# Patient Record
Sex: Male | Born: 1990 | Race: Black or African American | Hispanic: No | Marital: Single | Smoking: Never smoker
Health system: Southern US, Community
[De-identification: ages and names within clinical notes are randomized; demographics above are authoritative.]

---
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
## 2011-04-21 ENCOUNTER — Encounter: Payer: Self-pay | Admitting: *Deleted

## 2011-04-21 ENCOUNTER — Emergency Department (HOSPITAL_BASED_OUTPATIENT_CLINIC_OR_DEPARTMENT_OTHER)
Admission: EM | Admit: 2011-04-21 | Discharge: 2011-04-21 | Disposition: A | Discharge status: Home / Self Care | Payer: No Typology Code available for payment source | Attending: Emergency Medicine | Admitting: Emergency Medicine

## 2011-04-21 DIAGNOSIS — Y92410 Unspecified street and highway as the place of occurrence of the external cause: Secondary | ICD-10-CM | POA: Insufficient documentation

## 2011-04-21 DIAGNOSIS — M549 Dorsalgia, unspecified: Secondary | ICD-10-CM | POA: Insufficient documentation

## 2011-04-21 MED ORDER — CYCLOBENZAPRINE HCL 10 MG PO TABS: 10.0000 mg | ORAL_TABLET | Freq: Two times a day (BID) | ORAL | Status: AC | PRN

## 2011-04-21 MED ORDER — IBUPROFEN 600 MG PO TABS: 600.0000 mg | ORAL_TABLET | Freq: Four times a day (QID) | ORAL | Status: AC | PRN

## 2011-04-21 MED ORDER — OXYCODONE-ACETAMINOPHEN 5-325 MG PO TABS
1.0000 | ORAL_TABLET | Freq: Once | ORAL | Status: AC
  Administered 2011-04-21: 1 via ORAL

## 2011-04-21 MED ORDER — IBUPROFEN 400 MG PO TABS
600.0000 mg | ORAL_TABLET | Freq: Once | ORAL | Status: AC
  Administered 2011-04-21: 600 mg via ORAL
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
## 2013-07-20 ENCOUNTER — Ambulatory Visit (INDEPENDENT_AMBULATORY_CARE_PROVIDER_SITE_OTHER): Payer: 59 | Admitting: Emergency Medicine

## 2013-07-20 VITALS — BP 118/60 | HR 64 | Temp 98.7°F | Resp 18 | Ht 74.0 in | Wt 193.8 lb

## 2013-07-20 DIAGNOSIS — Z0000 Encounter for general adult medical examination without abnormal findings: Secondary | ICD-10-CM
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
## 2013-10-17 ENCOUNTER — Ambulatory Visit: Payer: 59

## 2017-06-14 ENCOUNTER — Ambulatory Visit (INDEPENDENT_AMBULATORY_CARE_PROVIDER_SITE_OTHER): Payer: Worker's Compensation

## 2017-06-14 ENCOUNTER — Ambulatory Visit (INDEPENDENT_AMBULATORY_CARE_PROVIDER_SITE_OTHER): Payer: Worker's Compensation | Admitting: Orthopaedic Surgery

## 2017-06-14 ENCOUNTER — Encounter (INDEPENDENT_AMBULATORY_CARE_PROVIDER_SITE_OTHER): Payer: Self-pay | Admitting: Orthopaedic Surgery

## 2017-06-14 DIAGNOSIS — M25561 Pain in right knee: Secondary | ICD-10-CM

## 2017-06-14 MED ORDER — IBUPROFEN 800 MG PO TABS: 800.0000 mg | ORAL_TABLET | Freq: Three times a day (TID) | ORAL | 3 refills | Status: DC | PRN

## 2017-06-14 NOTE — Progress Notes (Signed)
Office Visit Note   Patient: Jerome [name].           Date of Birth: [birth year]           MRN: [id number] Visit Date: 06/14/2017              Requested by: Daisy Floro, MD [address] [place], [place] [address] PCP: Daisy Floro, MD   Assessment & Plan: Visit Diagnoses:  1. Acute pain of right knee     Plan: Overall impression is acute lateral patella subluxation from hyperextension injury. His MRI findings are unremarkable as well as his x-ray. I offered him a cortisone injection which he declined.  Overall he is presenting with a fair amount of pain. The it's a good idea to hold off on physical therapy for a couple weeks before starting PT. Work note for sedentary work for 6 weeks. Follow-up at that time for recheck.  Follow-Up Instructions: Return in about 6 weeks (around 07/26/2017).   Orders:  Orders Placed This Encounter  Procedures  . XR KNEE 3 VIEW RIGHT   Meds ordered this encounter  Medications  . ibuprofen (ADVIL,MOTRIN) 800 MG tablet    Sig: Take 1 tablet (800 mg total) by mouth every 8 (eight) hours as needed.    Dispense:  30 tablet    Refill:  3      Procedures: No procedures performed   Clinical Data: No additional findings.   Subjective: Chief Complaint  Patient presents with  . Right Knee - Pain    Patient is a 26 year old gentleman who comes in with a one-month history of right knee pain status post a work-related injury in which he hyperextended his knee. He  originally had swelling and weakness and locking which she continues to have except for the swelling. He's been wearing a hinged knee brace. He has had x-rays and MRI which were normal. He is supposed to start physical therapy next week. He does not feel ready. Denies any numbness and tingling.     Review of Systems  Constitutional: Negative.   All other systems reviewed and are negative.    Objective: Vital Signs: There were no vitals taken for this  visit.  Physical Exam  Constitutional: He is oriented to person, place, and time. He appears well-developed and well-nourished.  HENT:  Head: Normocephalic and atraumatic.  Eyes: Pupils are equal, round, and reactive to light.  Neck: Neck supple.  Pulmonary/Chest: Effort normal.  Abdominal: Soft.  Musculoskeletal: Normal range of motion.  Neurological: He is alert and oriented to person, place, and time.  Skin: Skin is warm.  Psychiatric: He has a normal mood and affect. His behavior is normal. Judgment and thought content normal.  Nursing note and vitals reviewed.   Ortho Exam Right knee exam shows no joint effusion. Collaterals and cruciates are grossly intact. He does have lateral joint line tenderness. Patellar tracking is normal. Specialty Comments:  No specialty comments available.  Imaging: Xr Knee 3 View Right  Result Date: 06/14/2017 No acute bony abnormalities    PMFS History: There are no active problems to display for this patient.  No past medical history on file.  Family History  Problem Relation Age of Onset  . Diabetes Mother   . Hypertension Father   . Diabetes Paternal Grandmother   . Hypertension Paternal Grandmother     No past surgical history on file. Social History   Occupational History  . Not on file.  Social History Main Topics  . Smoking status: Never Smoker  . Smokeless tobacco: Never Used  . Alcohol use No  . Drug use: No  . Sexual activity: No

## 2017-06-24 ENCOUNTER — Telehealth (INDEPENDENT_AMBULATORY_CARE_PROVIDER_SITE_OTHER): Payer: Self-pay

## 2017-06-24 NOTE — Telephone Encounter (Signed)
Faxed the 06/16/17 office and work note to [employer] Jerome [name] [phone]

## 2017-07-07 ENCOUNTER — Telehealth (INDEPENDENT_AMBULATORY_CARE_PROVIDER_SITE_OTHER): Payer: Self-pay | Admitting: Orthopaedic Surgery

## 2017-07-07 NOTE — Telephone Encounter (Signed)
[name],Jerome Jr. 1990/12/09    [facility]  Reference number pt ([id number]) Contact [phone]  Please call to verify if an MRI order was placed

## 2017-07-08 NOTE — Telephone Encounter (Signed)
Last seen  06/14/17 xrays were done that day   Per Dr Roda ShuttersXu His MRI findings are unremarkable as well as his x-ray. (he must of brought in CD of MRI)  We do not have record of where he had this done. MRI order is not made in our system.

## 2017-07-26 ENCOUNTER — Ambulatory Visit (INDEPENDENT_AMBULATORY_CARE_PROVIDER_SITE_OTHER): Payer: Worker's Compensation | Admitting: Orthopaedic Surgery

## 2017-07-26 ENCOUNTER — Encounter (INDEPENDENT_AMBULATORY_CARE_PROVIDER_SITE_OTHER): Payer: Self-pay | Admitting: Orthopaedic Surgery

## 2017-07-26 DIAGNOSIS — M25561 Pain in right knee: Secondary | ICD-10-CM

## 2017-07-26 MED ORDER — LIDOCAINE HCL 1 % IJ SOLN
2.0000 mL | INTRAMUSCULAR | Status: AC | PRN
  Administered 2017-07-26: 2 mL

## 2017-07-26 MED ORDER — METHYLPREDNISOLONE ACETATE 40 MG/ML IJ SUSP
40.0000 mg | INTRAMUSCULAR | Status: AC | PRN
  Administered 2017-07-26: 40 mg via INTRA_ARTICULAR

## 2017-07-26 MED ORDER — BUPIVACAINE HCL 0.5 % IJ SOLN
2.0000 mL | INTRAMUSCULAR | Status: AC | PRN
  Administered 2017-07-26: 2 mL via INTRA_ARTICULAR

## 2017-07-26 NOTE — Progress Notes (Signed)
   Office Visit Note   Patient: Jerome GrumblingDouglas J Amir Jr.           Date of Birth: 04/02/1991           MRN: [id number] Visit Date: 07/26/2017              Requested by: Daisy Florooss, Charles Alan, MD [address] [place], [place] [address] PCP: Daisy Florooss, Charles Alan, MD   Assessment & Plan: Visit Diagnoses:  1. Acute pain of right knee     Plan: Impression is right knee pain.  Cortisone injection was performed today.  I would like him to do 6 weeks of physical therapy and then reevaluate after that.  Work note provided today.  Follow-up in 6 weeks.  Follow-Up Instructions: Return in about 6 weeks (around 09/06/2017).   Orders:  No orders of the defined types were placed in this encounter.  No orders of the defined types were placed in this encounter.     Procedures: Large Joint Inj: R knee on 07/26/2017 2:24 PM Indications: pain Details: 22 G needle  Arthrogram: No  Medications: 40 mg methylPREDNISolone acetate 40 MG/ML; 2 mL lidocaine 1 %; 2 mL bupivacaine 0.5 % Consent was given by the patient. Patient was prepped and draped in the usual sterile fashion.       Clinical Data: No additional findings.   Subjective: Chief Complaint  Patient presents with  . Right Knee - Pain    Patient follows up today for right knee pain.  She has recently had his physical therapy approved by [employer].  He continues to have right knee pain.    Review of Systems   Objective: Vital Signs: There were no vitals taken for this visit.  Physical Exam  Ortho Exam Right knee exam shows no joint effusion.  Collaterals and cruciates are stable.  Mild medial joint line tenderness.  Patella tracking is normal. Specialty Comments:  No specialty comments available.  Imaging: No results found.   PMFS History: There are no active problems to display for this patient.  History reviewed. No pertinent past medical history.  Family History  Problem Relation Age of Onset  . Diabetes  Mother   . Hypertension Father   . Diabetes Paternal Grandmother   . Hypertension Paternal Grandmother     History reviewed. No pertinent surgical history. Social History   Occupational History  . Not on file  Tobacco Use  . Smoking status: Never Smoker  . Smokeless tobacco: Never Used  Substance and Sexual Activity  . Alcohol use: No  . Drug use: No  . Sexual activity: No

## 2017-08-02 ENCOUNTER — Telehealth (INDEPENDENT_AMBULATORY_CARE_PROVIDER_SITE_OTHER): Payer: Self-pay

## 2017-08-02 NOTE — Telephone Encounter (Signed)
Faxed the 07/26/17 office note to the adj per her request

## 2017-08-16 ENCOUNTER — Telehealth (INDEPENDENT_AMBULATORY_CARE_PROVIDER_SITE_OTHER): Payer: Self-pay

## 2017-08-16 NOTE — Telephone Encounter (Signed)
[facility] the 06/14/17 [facility]26/17 [facility] per their request [phone]#[phone]

## 2017-08-30 HISTORY — PX: KNEE ARTHROSCOPY: SHX127

## 2017-09-08 ENCOUNTER — Encounter (INDEPENDENT_AMBULATORY_CARE_PROVIDER_SITE_OTHER): Payer: Self-pay | Admitting: Orthopaedic Surgery

## 2017-09-08 ENCOUNTER — Ambulatory Visit (INDEPENDENT_AMBULATORY_CARE_PROVIDER_SITE_OTHER): Payer: Worker's Compensation | Admitting: Orthopaedic Surgery

## 2017-09-08 DIAGNOSIS — M25561 Pain in right knee: Secondary | ICD-10-CM

## 2017-09-08 MED ORDER — MELOXICAM 7.5 MG PO TABS: 7.5000 mg | ORAL_TABLET | Freq: Every day | ORAL | 2 refills | Status: DC | PRN

## 2017-09-08 MED ORDER — HYDROCODONE-ACETAMINOPHEN 7.5-325 MG PO TABS: 1.0000 | ORAL_TABLET | Freq: Two times a day (BID) | ORAL | 0 refills | Status: DC | PRN

## 2017-09-08 NOTE — Progress Notes (Signed)
Office Visit Note   Patient: Jerome [name].           Date of Birth: 01/10/1991           MRN: [id number] Visit Date: 09/08/2017              RequCarolanne Grumblingested by: Daisy Florooss, Charles Alan, MD [address] [place], [place] [address] PCP: Daisy Florooss, Charles Alan, MD   Assessment & Plan: Visit Diagnoses:  1. Acute pain of right knee     Plan: At this point I would like for Jerome [name] to continue with outpatient physical therapy for at least another 3 weeks.  I have refilled his Norco.  Likely we will not refill this again.  I also placed him on Mobic as needed.  He will follow-up with us in 3 weeks time, at that point if he is not any better we may entertain proceeding with a diagnostic arthroscopy.  Follow-Up Instructions: Return in about 3 weeks (around 09/29/2017).   Orders:  No orders of the defined types were placed in this encounter.  Meds ordered this encounter  Medications  . HYDROcodone-acetaminophen (NORCO) 7.5-325 MG tablet    Sig: Take 1 tablet by mouth 2 (two) times daily as needed for moderate pain.    Dispense:  10 tablet    Refill:  0    Order Specific Question:   Supervising Provider    Answer:   Tarry KosXU, NAIPING M [[id number]]  . meloxicam (MOBIC) 7.5 MG tablet    Sig: Take 1 tablet (7.5 mg total) by mouth daily as needed for up to 14 doses for pain.    Dispense:  30 tablet    Refill:  2    Order Specific Question:   Supervising Provider    Answer:   Tarry KosXU, NAIPING M [[id number]]      Procedures: No procedures performed   Clinical Data: No additional findings.   Subjective: Chief Complaint  Patient presents with  . Right Knee - Pain    HPI this is a pleasant 27 year old gentleman who presents our clinic today for follow-up of his right knee.  Initial injury is under [employer]. which did occur on 05/17/2017 when he hyperflexed his knee.  Since his injury he has had x-rays as well as an MRI of his right knee which were both negative.  He had a cortisone injection on  07/26/2017 which made his pain worse.  He states he did not have relief even with Marcaine in place.  We have written him to get outpatient physical therapy for which he is been in last 3 weeks.  He feels as though his pain is really not much better.  He is still working light duty.  Review of Systems as detailed in HPI.  All others reviewed and are negative.   Objective: Vital Signs: There were no vitals taken for this visit.  Physical Exam well-developed well-nourished gentleman in no acute distress.  Alert and oriented x3.  Ortho Exam examination of his right knee reveals range of motion from 0-75 degrees.  Minimal patellofemoral crepitus.  No patellar apprehension.  He is stable valgus varus stress.  He is to rest intact distally.  Specialty Comments:  No specialty comments available.  Imaging: No results found.   PMFS History: There are no active problems to display for this patient.  History reviewed. No pertinent past medical history.  Family History  Problem Relation Age of Onset  . Diabetes Mother   . Hypertension Father   .  Diabetes Paternal Grandmother   . Hypertension Paternal Grandmother     History reviewed. No pertinent surgical history. Social History   Occupational History  . Not on file  Tobacco Use  . Smoking status: Never Smoker  . Smokeless tobacco: Never Used  Substance and Sexual Activity  . Alcohol use: No  . Drug use: No  . Sexual activity: No

## 2017-09-14 ENCOUNTER — Telehealth (INDEPENDENT_AMBULATORY_CARE_PROVIDER_SITE_OTHER): Payer: Self-pay

## 2017-09-14 NOTE — Telephone Encounter (Signed)
Faxed the 09/08/17 office note to the [facility] adj per her request

## 2017-10-04 ENCOUNTER — Encounter (INDEPENDENT_AMBULATORY_CARE_PROVIDER_SITE_OTHER): Payer: Self-pay | Admitting: Orthopaedic Surgery

## 2017-10-04 ENCOUNTER — Ambulatory Visit (INDEPENDENT_AMBULATORY_CARE_PROVIDER_SITE_OTHER): Payer: Worker's Compensation | Admitting: Orthopaedic Surgery

## 2017-10-04 DIAGNOSIS — G8929 Other chronic pain: Secondary | ICD-10-CM

## 2017-10-04 DIAGNOSIS — M25561 Pain in right knee: Secondary | ICD-10-CM | POA: Diagnosis not present

## 2017-10-04 MED ORDER — IBUPROFEN-FAMOTIDINE 800-26.6 MG PO TABS: 1.0000 | ORAL_TABLET | Freq: Three times a day (TID) | ORAL | 0 refills | Status: DC | PRN

## 2017-10-04 NOTE — Progress Notes (Signed)
Office Visit Note   Patient: Jerome GrumblingDouglas J Hollan Jr.           Date of Birth: 07/05/1991           MRN: [id number] Visit Date: 10/04/2017              Requested by: Daisy Florooss, Charles Alan, MD [address] [place], [place] [address] PCP: Daisy Florooss, Charles Alan, MD   Assessment & Plan: Visit Diagnoses:  1. Chronic pain of right knee     Plan: Impression is 27 year old gentleman with continued right knee pain from hyperextension injury about 5-6 months ago that he sustained at work.  His MRI has been negative for structural abnormalities.  His MRI shows edema around the medial patellar facet.  We discussed diagnostic arthroscopy versus a second opinion which I am more in favor of.  His MRI does show some edema around the soft tissue overlying the medial aspect of the patella which she is tender to palpation from.  I also wonder if he is having some sort of abnormal pain response but it does not seem like it is typical CRPS.  I will see him back after his second opinion.  Prescription for Duexis. Total face to face encounter time was greater than 25 minutes and over half of this time was spent in counseling and/or coordination of care.  Follow-Up Instructions: Return if symptoms worsen or fail to improve.   Orders:  Orders Placed This Encounter  Procedures  . Ambulatory referral to Orthopedic Surgery   Meds ordered this encounter  Medications  . Ibuprofen-Famotidine 800-26.6 MG TABS    Sig: Take 1 tablet by mouth 3 (three) times daily as needed.    Dispense:  90 tablet    Refill:  0      Procedures: No procedures performed   Clinical Data: No additional findings.   Subjective: Chief Complaint  Patient presents with  . Right Knee - Pain     Patient follows up today for his right knee pain.  He states that he continues to have pain.  Physical therapy has not helped.    Review of Systems  Constitutional: Negative.   All other systems reviewed and are  negative.    Objective: Vital Signs: There were no vitals taken for this visit.  Physical Exam  Constitutional: He is oriented to person, place, and time. He appears well-developed and well-nourished.  HENT:  Head: Normocephalic and atraumatic.  Eyes: Pupils are equal, round, and reactive to light.  Neck: Neck supple.  Pulmonary/Chest: Effort normal.  Abdominal: Soft.  Musculoskeletal: Normal range of motion.  Neurological: He is alert and oriented to person, place, and time.  Skin: Skin is warm.  Psychiatric: He has a normal mood and affect. His behavior is normal. Judgment and thought content normal.  Nursing note and vitals reviewed.   Ortho Exam Right knee exam is stable.  Collaterals and cruciates are stable.  No joint effusion.   He has tenderness along the medial patellar retinaculum.  Patella mobility is normal. Specialty Comments:  No specialty comments available.  Imaging: No results found.   PMFS History: There are no active problems to display for this patient.  No past medical history on file.  Family History  Problem Relation Age of Onset  . Diabetes Mother   . Hypertension Father   . Diabetes Paternal Grandmother   . Hypertension Paternal Grandmother     No past surgical history on file. Social History  Occupational History  . Not on file  Tobacco Use  . Smoking status: Never Smoker  . Smokeless tobacco: Never Used  Substance and Sexual Activity  . Alcohol use: No  . Drug use: No  . Sexual activity: No

## 2017-10-10 ENCOUNTER — Telehealth (INDEPENDENT_AMBULATORY_CARE_PROVIDER_SITE_OTHER): Payer: Self-pay | Admitting: Orthopaedic Surgery

## 2017-10-10 NOTE — Telephone Encounter (Signed)
10/04/2017 OV NOTE FAXED TO SHEIL[facility] [phone]

## 2017-10-11 ENCOUNTER — Telehealth (INDEPENDENT_AMBULATORY_CARE_PROVIDER_SITE_OTHER): Payer: Self-pay | Admitting: Orthopaedic Surgery

## 2017-10-11 NOTE — Telephone Encounter (Deleted)
[facility]  Jerome [name]   Fax num

## 2017-10-12 NOTE — Telephone Encounter (Signed)
Faxed last office note and referral to Darl PikesSusan (nurse)w/[facility] Fax#[phone]

## 2017-10-12 NOTE — Telephone Encounter (Signed)
[facility] Hassan BucklerSusan  Direct line [phone] Ext: [phone]  Fax # [phone]    [facility] requesting demographic info for pt   Office visit notes  Any work status change notes

## 2017-11-29 ENCOUNTER — Ambulatory Visit: Admit: 2017-11-29 | Payer: 59 | Admitting: Orthopaedic Surgery

## 2017-11-29 SURGERY — ARTHROSCOPY, KNEE
Anesthesia: Choice | Laterality: Right

## 2018-01-04 ENCOUNTER — Other Ambulatory Visit (INDEPENDENT_AMBULATORY_CARE_PROVIDER_SITE_OTHER): Payer: Self-pay | Admitting: Orthopaedic Surgery

## 2018-01-05 NOTE — Telephone Encounter (Signed)
Do you want to fill this Rx?

## 2018-02-12 ENCOUNTER — Other Ambulatory Visit (INDEPENDENT_AMBULATORY_CARE_PROVIDER_SITE_OTHER): Payer: Self-pay | Admitting: Orthopaedic Surgery

## 2018-10-23 ENCOUNTER — Emergency Department (HOSPITAL_BASED_OUTPATIENT_CLINIC_OR_DEPARTMENT_OTHER)
Admission: EM | Admit: 2018-10-23 | Discharge: 2018-10-23 | Disposition: A | Discharge status: Home / Self Care | Payer: No Typology Code available for payment source | Attending: Emergency Medicine | Admitting: Emergency Medicine

## 2018-10-23 ENCOUNTER — Emergency Department (HOSPITAL_BASED_OUTPATIENT_CLINIC_OR_DEPARTMENT_OTHER): Payer: No Typology Code available for payment source

## 2018-10-23 ENCOUNTER — Encounter (HOSPITAL_BASED_OUTPATIENT_CLINIC_OR_DEPARTMENT_OTHER): Payer: Self-pay | Admitting: *Deleted

## 2018-10-23 ENCOUNTER — Other Ambulatory Visit: Payer: Self-pay

## 2018-10-23 DIAGNOSIS — J111 Influenza due to unidentified influenza virus with other respiratory manifestations: Secondary | ICD-10-CM | POA: Diagnosis not present

## 2018-10-23 DIAGNOSIS — R509 Fever, unspecified: Secondary | ICD-10-CM | POA: Diagnosis present

## 2018-10-23 DIAGNOSIS — R69 Illness, unspecified: Secondary | ICD-10-CM

## 2018-10-23 MED ORDER — ACETAMINOPHEN 325 MG PO TABS
650.0000 mg | ORAL_TABLET | Freq: Once | ORAL | Status: AC
  Administered 2018-10-23: 650 mg via ORAL
  Filled 2018-10-23: qty 2

## 2018-10-23 MED ORDER — HYDROCOD POLST-CPM POLST ER 10-8 MG/5ML PO SUER
5.0000 mL | Freq: Once | ORAL | Status: AC
  Administered 2018-10-23: 5 mL via ORAL
  Filled 2018-10-23: qty 5

## 2018-10-23 MED ORDER — DEXTROMETHORPHAN HBR 15 MG/5ML PO SYRP: 10.0000 mL | ORAL_SOLUTION | Freq: Four times a day (QID) | ORAL | 0 refills | Status: DC | PRN

## 2018-10-23 NOTE — Discharge Instructions (Addendum)
Your symptoms are likely caused by the flu. This is a viral infection that will likely start to improve after 5-7 days, antibiotics are not helpful in treating viral infections.  Since your symptoms have been present for more than 2 days Tamiflu will give no additional benefit.  Please make sure you are drinking plenty of fluids. You can treat your symptoms supportively with tylenol/ibuprofen for fevers and pains, Zyrtec and Flonase to heal with nasal congestion, and prescription for cough medicine sent to your pharmacy. You can also try throat lozenges to help with cough.  Unfortunately the flu can cause you to feel bad for several days. If your symptoms are not improving please follow up with you Primary doctor.   Do not drive while taking cough syrup.  If you develop persistent fevers, shortness of breath or difficulty breathing, chest pain, severe headache and neck pain, persistent nausea and vomiting or other new or concerning symptoms return to the Emergency department.

## 2018-10-23 NOTE — ED Notes (Signed)
ED Provider at bedside. 

## 2018-10-23 NOTE — ED Triage Notes (Signed)
Fever, body aches, headache, and chills x 1 week. He took Ibuprofen 1,600 mg today for fever. Pt was educated not to take that amount of Ibuprofen again. He was started on Amoxicillin yesterday for sinusitis. He thinks he has the flu.

## 2018-10-23 NOTE — ED Provider Notes (Signed)
[facility] EMERGENCY DEPARTMENT Provider Note   CSN: [id number] Arrival date & time: 10/23/18  1531    History   Chief Complaint Chief Complaint  Patient presents with  . Fever    HPI Jerome [name]. is a 28 y.o. male otherwise healthy presenting to emergency department today with chief complaint of cough x1 week.  Patient reports the cough is nonproductive.  He was seen by his PCP yesterday, thought to have a sinus infection and started on amoxicillin.  Patient reports he does not feel symptom improvement after starting the amoxicillin. Pt is also reporting associated fever, body aches, headaches, chills.  He has been taking ibuprofen and reports that he took 1600 mg yesterday.  Patient's T-max prior to arrival was 101.7 oral.  Patient reports multiple sick contacts.  Denies shortness of breath, sore throat, chest pain, abdominal pain, nausea, vomiting, diarrhea, urinary symptoms. History provided by patient.    History reviewed. No pertinent past medical history. There are no active problems to display for this patient.   History reviewed. No pertinent surgical history.      Home Medications    Prior to Admission medications   Medication Sig Start Date End Date Taking? Authorizing Provider  dextromethorphan 15 MG/5ML syrup Take 10 mLs (30 mg total) by mouth 4 (four) times daily as needed for cough. 10/23/18   Pang Robers E, PA-C  DUEXIS 800-26.6 MG TABS TAKE 1 TABLET BY MOUTH THREE TIMES A DAY IF NEEDED 02/13/18   Tarry Kos, MD  meloxicam (MOBIC) 7.5 MG tablet Take 1 tablet (7.5 mg total) by mouth daily as needed for up to 14 doses for pain. 09/08/17   Cristie Hem, PA-C    Family History Family History  Problem Relation Age of Onset  . Diabetes Mother   . Hypertension Father   . Diabetes Paternal Grandmother   . Hypertension Paternal Grandmother     Social History Social History   Tobacco Use  . Smoking status: Never Smoker  .  Smokeless tobacco: Never Used  Substance Use Topics  . Alcohol use: No  . Drug use: No     Allergies   Patient has no known allergies.   Review of Systems Review of Systems  Constitutional: Positive for fever.  HENT: Positive for congestion. Negative for ear pain, sinus pressure, sinus pain and sore throat.   Respiratory: Positive for cough.   Gastrointestinal: Negative for abdominal pain, diarrhea, nausea and vomiting.  Neurological: Positive for headaches.  All other systems reviewed and are negative.    Physical Exam Updated Vital Signs BP 127/82 (BP Location: Right Arm)   Pulse 81   Temp 99.2 F (37.3 C) (Oral)   Resp 16   Ht  (1.93 m)   Wt 125.6 kg   SpO2 99%   BMI 33.72 kg/m   Physical Exam Vitals signs and nursing note reviewed.  Constitutional:      Appearance: He is not toxic-appearing or diaphoretic.     Comments: Patient is febrile.  He is coughing and talking in short sentences.  No accessory muscle use.  No pursed lip breathing or nasal flaring.  HENT:     Head: Normocephalic and atraumatic.     Right Ear: Tympanic membrane normal.     Left Ear: Tympanic membrane normal.     Nose: Nose normal.     Mouth/Throat:     Mouth: Mucous membranes are dry.     Pharynx: Oropharynx is clear.  Eyes:     General: No scleral icterus.    Extraocular Movements: Extraocular movements intact.     Conjunctiva/sclera: Conjunctivae normal.     Pupils: Pupils are equal, round, and reactive to light.  Neck:     Musculoskeletal: Normal range of motion.  Cardiovascular:     Rate and Rhythm: Regular rhythm. Tachycardia present.     Pulses: Normal pulses.     Heart sounds: Normal heart sounds.  Pulmonary:     Effort: Pulmonary effort is normal.     Breath sounds: Normal breath sounds. No wheezing.  Abdominal:     General: There is no distension.     Palpations: Abdomen is soft.     Tenderness: There is no abdominal tenderness. There is no guarding or rebound.    Musculoskeletal: Normal range of motion.  Skin:    General: Skin is warm.     Capillary Refill: Capillary refill takes less than 2 seconds.     Findings: No rash.  Neurological:     Mental Status: He is alert. Mental status is at baseline.     Motor: No weakness.  Psychiatric:        Behavior: Behavior normal.      ED Treatments / Results  Labs (all labs ordered are listed, but only abnormal results are displayed) Labs Reviewed - No data to display  EKG None  Radiology Dg Chest 2 View  Result Date: 10/23/2018 CLINICAL DATA:  Cough, fever, and chills for 1 week. EXAM: CHEST - 2 VIEW COMPARISON:  None. FINDINGS: The heart size and mediastinal contours are within normal limits. Both lungs are clear. The visualized skeletal structures are unremarkable. IMPRESSION: No active cardiopulmonary disease. Electronically Signed   By: Myles Rosenthal M.D.   On: 10/23/2018 17:23    Procedures Procedures (including critical care time)  Medications Ordered in ED Medications  chlorpheniramine-HYDROcodone (TUSSIONEX) 10-8 MG/5ML suspension 5 mL (5 mLs Oral Given 10/23/18 1722)  acetaminophen (TYLENOL) tablet 650 mg (650 mg Oral Given 10/23/18 1722)     Initial Impression / Assessment and Plan / ED Course  I have reviewed the triage vital signs and the nursing notes.  Pertinent labs & imaging results that were available during my care of the patient were reviewed by me and considered in my medical decision making (see chart for details).    Patient with symptoms consistent with influenza.  Vitals are stable, low-grade fever.  No signs of dehydration, tolerating PO's.  Lungs are clear. Given duration of cough,  chest xray ordered and is negative for acute infectious processes. Discussed the risk versus benefit of Tamiflu treatment with the patient.  The patient understands that symptoms are greater than the recommended 24-48 hour window of treatment.  Patient will be discharged with instructions  to orally hydrate, rest, and use over-the-counter medications such as anti-inflammatories ibuprofen and Aleve for muscle aches and Tylenol for fever.  Patient will also be given a cough suppressant.  Discussed taking ibuprofen and Tylenol as directed and not going over the maximum dose/day. Pt's fever treated with Tylenol, he has normal heart rate at discharge. Recommend PCP follow-up in 1 week. Discussed strict ED return precautions. Pt verbalized understanding of and is in agreement with this plan. Pt stable for discharge home at this time.      Final Clinical Impressions(s) / ED Diagnoses   Final diagnoses:  Influenza-like illness    ED Discharge Orders         Ordered  dextromethorphan 15 MG/5ML syrup  4 times daily PRN     10/23/18 1805           Kathyrn Lass 10/23/18 2328    Maia Plan, MD 10/24/18 1019

## 2019-06-19 ENCOUNTER — Other Ambulatory Visit: Payer: Self-pay

## 2019-06-19 DIAGNOSIS — Z20822 Contact with and (suspected) exposure to covid-19: Secondary | ICD-10-CM

## 2019-06-21 LAB — NOVEL CORONAVIRUS, NAA: SARS-CoV-2, NAA: NOT DETECTED

## 2019-06-22 ENCOUNTER — Telehealth: Payer: Self-pay | Admitting: General Practice

## 2019-06-22 NOTE — Telephone Encounter (Signed)
Negative COVID results given. Patient results "NOT Detected." Caller expressed understanding. ° °

## 2019-08-10 ENCOUNTER — Other Ambulatory Visit: Payer: Self-pay

## 2019-08-10 DIAGNOSIS — Z20822 Contact with and (suspected) exposure to covid-19: Secondary | ICD-10-CM

## 2019-08-12 LAB — NOVEL CORONAVIRUS, NAA: SARS-CoV-2, NAA: NOT DETECTED

## 2019-11-23 ENCOUNTER — Ambulatory Visit: Payer: Medicaid Other | Attending: Internal Medicine

## 2019-11-23 DIAGNOSIS — Z23 Encounter for immunization: Secondary | ICD-10-CM

## 2019-11-23 NOTE — Progress Notes (Signed)
   [id number]  Name:  Patrik Turnbaugh.    MRN: [id number] DOB: 06/28/1991  11/23/2019  Mr. Mauritz was observed post Covid-19 immunization for 15 minutes without incident. He was provided with Vaccine Information Sheet and instruction to access the V-Safe system.   Mr. Shirer was instructed to call 911 with any severe reactions post vaccine: Marland Kitchen Difficulty breathing  . Swelling of face and throat  . A fast heartbeat  . A bad rash all over body  . Dizziness and weakness   Immunizations Administered    Name Date Dose VIS Date Route   Pfizer COVID-19 Vaccine 11/23/2019  2:46 PM 0.3 mL 08/10/2019 Intramuscular   Manufacturer: [employer], [employer]   Lot: [id number]   NDC: [address]-2

## 2019-12-18 ENCOUNTER — Ambulatory Visit: Payer: Medicaid Other | Attending: Internal Medicine

## 2019-12-18 DIAGNOSIS — Z23 Encounter for immunization: Secondary | ICD-10-CM

## 2019-12-18 NOTE — Progress Notes (Signed)
   [facility]  Name:  [name].    MRN: [id number] DOB: March 15, 1991  12/18/2019  Mr. Fariss was observed post Covid-19 immunization for 15 minutes without incident. He was provided with Vaccine Information Sheet and instruction to access the V-Safe system.   Mr. Raburn was instructed to call 911 with any severe reactions post vaccine: Marland Kitchen Difficulty breathing  . Swelling of face and throat  . A fast heartbeat  . A bad rash all over body  . Dizziness and weakness   Immunizations Administered    Name Date Dose VIS Date Route   Pfizer COVID-19 Vaccine 12/18/2019  4:39 PM 0.3 mL 10/24/2018 Intramuscular   Manufacturer: [employer], [employer]   Lot: [id number]   NDC: [address]-0

## 2021-01-15 ENCOUNTER — Other Ambulatory Visit: Payer: Self-pay

## 2021-01-15 ENCOUNTER — Other Ambulatory Visit: Payer: Self-pay | Admitting: Nurse Practitioner

## 2021-01-15 ENCOUNTER — Ambulatory Visit
Admission: RE | Admit: 2021-01-15 | Discharge: 2021-01-15 | Disposition: A | Discharge status: Home / Self Care | Payer: 59 | Source: Ambulatory Visit | Attending: Nurse Practitioner | Admitting: Nurse Practitioner

## 2021-01-15 DIAGNOSIS — R609 Edema, unspecified: Secondary | ICD-10-CM

## 2021-01-15 DIAGNOSIS — R52 Pain, unspecified: Secondary | ICD-10-CM

## 2021-03-11 ENCOUNTER — Encounter: Payer: Self-pay | Admitting: Nurse Practitioner

## 2021-03-11 ENCOUNTER — Ambulatory Visit: Payer: 59 | Admitting: Nurse Practitioner

## 2021-03-11 VITALS — BP 130/88 | HR 70 | Ht 76.0 in | Wt 283.0 lb

## 2021-03-11 DIAGNOSIS — R208 Other disturbances of skin sensation: Secondary | ICD-10-CM | POA: Diagnosis not present

## 2021-03-11 DIAGNOSIS — K625 Hemorrhage of anus and rectum: Secondary | ICD-10-CM

## 2021-03-11 MED ORDER — AMBULATORY NON FORMULARY MEDICATION: 1 refills | Status: DC

## 2021-03-11 NOTE — Progress Notes (Signed)
ASSESSMENT AND PLAN    #30 year old male with a one month history of rectal bleeding / rectal pain with bowel movements.  Marked posterior midline discomfort upon an attempt at DRE.  Suspect anal fissure.  --Start daily fiber --Diltiazem gel 3 times daily x 4-5 weeks. Follow up with me in ~ 6 weeks.  If bleeding persists then will schedule for colonoscopy for further evaluation.  -- Call in the interim for worsening symptoms   HISTORY OF PRESENT ILLNESS     Chief Complaint : rectal bleeding   Jerome [name]. is a 30 y.o. male , new to the practice, here for evaluation of rectal bleeding.  Patient has no significant past medical history  Jerome [name] is here for evaluation of rectal bleeding.  The rectal bleeding occurs with bowel movements and started about 1 month ago.  Initially he was having some burning while passing stool but now the burning is following defecation.  He also complains of an aching sensation in his rectum.  Patient says he was not having any problems with constipation/straining when the bleeding started.  He generally has anywhere from 1-4 bowel movements a day depending on what he eats . He did develop constipation a few weeks ago, possibly out of dread for having a bowel movement due to the pain it causes.  He bought fiber, sounds like Citrucel but has not taken it on a regular basis.  Patient saw his PCP for the rectal bleeding and was given a medication which looks like a "bullet". He only use the suppository for couple of days as he did not find it helpful.  Over the last year Jerome [name] has had some mild RLQ discomfort relieved with bowel movements.  No other GI complaints.  No family history of colon cancer.    No past medical history on file.   No past surgical history on file. Family History  Problem Relation Age of Onset   Diabetes Mother    Hypertension Father    Diabetes Paternal Grandmother    Hypertension Paternal Grandmother    Social History    Tobacco Use   Smoking status: Never   Smokeless tobacco: Never  Substance Use Topics   Alcohol use: No   Drug use: No   No current outpatient medications on file.   No current facility-administered medications for this visit.   No Known Allergies   Review of Systems: All systems reviewed and negative except where noted in HPI.    PHYSICAL EXAM :    Wt Readings from Last 3 Encounters:  03/11/21 283 lb (128.4 kg)  10/23/18 277 lb (125.6 kg)  07/20/13 193 lb 12.8 oz (87.9 kg)    BP 130/88   Pulse 70   Ht 6\' 4"  (1.93 m)   Wt 283 lb (128.4 kg)   BMI 34.45 kg/m  Constitutional:  Pleasant male in no acute distress. Psychiatric: Normal mood and affect. Behavior is normal. EENT: Pupils normal.  Conjunctivae are normal. No scleral icterus. Neck supple.  Cardiovascular: Normal rate, regular rhythm. No edema Pulmonary/chest: Effort normal and breath sounds normal. No wheezing, rales or rhonchi. Abdominal: Soft, nondistended, nontender. Bowel sounds active throughout. There are no masses palpable. No hepatomegaly. Rectal: No external hemorrhoids seen.  No obvious anal fissures.  Significant discomfort upon an attempt to perform a DRE ( especially posterior midline) Neurological: Alert and oriented to person place and time. Skin: Skin is warm and dry. No rashes noted.  , NP  03/11/2021, 1:43 PM

## 2021-03-11 NOTE — Patient Instructions (Addendum)
We have sent a prescription for Diltazem gel to [facility]. You should apply a pea size amount to your rectum three times daily x 6-8 weeks.  [facility]'s information is below: Address: [address], [place], [place] [address]  [phone]  *Please DO NOT go directly from our office to pick up this medication! Give the pharmacy 1 day to process the prescription as this is compounded and takes time to make.   Take Fiber everyday  If you are age 31 or older, your body mass index should be between 23-30. Your Body mass index is 34.45 kg/m. If this is out of the aforementioned range listed, please consider follow up with your Primary Care Provider.  If you are age 28 or younger, your body mass index should be between 19-25. Your Body mass index is 34.45 kg/m. If this is out of the aformentioned range listed, please consider follow up with your Primary Care Provider.   __________________________________________________________  The [place] [facility] providers would like to encourage you to use [facility] to communicate with providers for non-urgent requests or questions.  Due to long hold times on the telephone, sending your provider a message by [facility] may be a faster and more efficient way to get a response.  Please allow 48 business hours for a response.  Please remember that this is for non-urgent requests.    I appreciate the  opportunity to care for you  Thank You   Midge Minium

## 2021-03-11 NOTE — Progress Notes (Signed)
Attending Physician's Attestation   I have reviewed the chart.   I agree with the Advanced Practitioner's note, impression, and recommendations with any updates as below.    Joellyn Grandt Mansouraty, MD [facility] Office # [phone]  

## 2021-05-06 ENCOUNTER — Ambulatory Visit: Payer: 59 | Admitting: Nurse Practitioner

## 2021-12-02 IMAGING — CR DG FOOT COMPLETE 3+V*R*
3 series · 3 of 3 positions shown · non-contrast
Comparison: None.

CLINICAL DATA: Severe pain and swelling right lateral foot. Rule
out fracture.

EXAM:
RIGHT FOOT COMPLETE - 3+ VIEW

[x foot ap right]
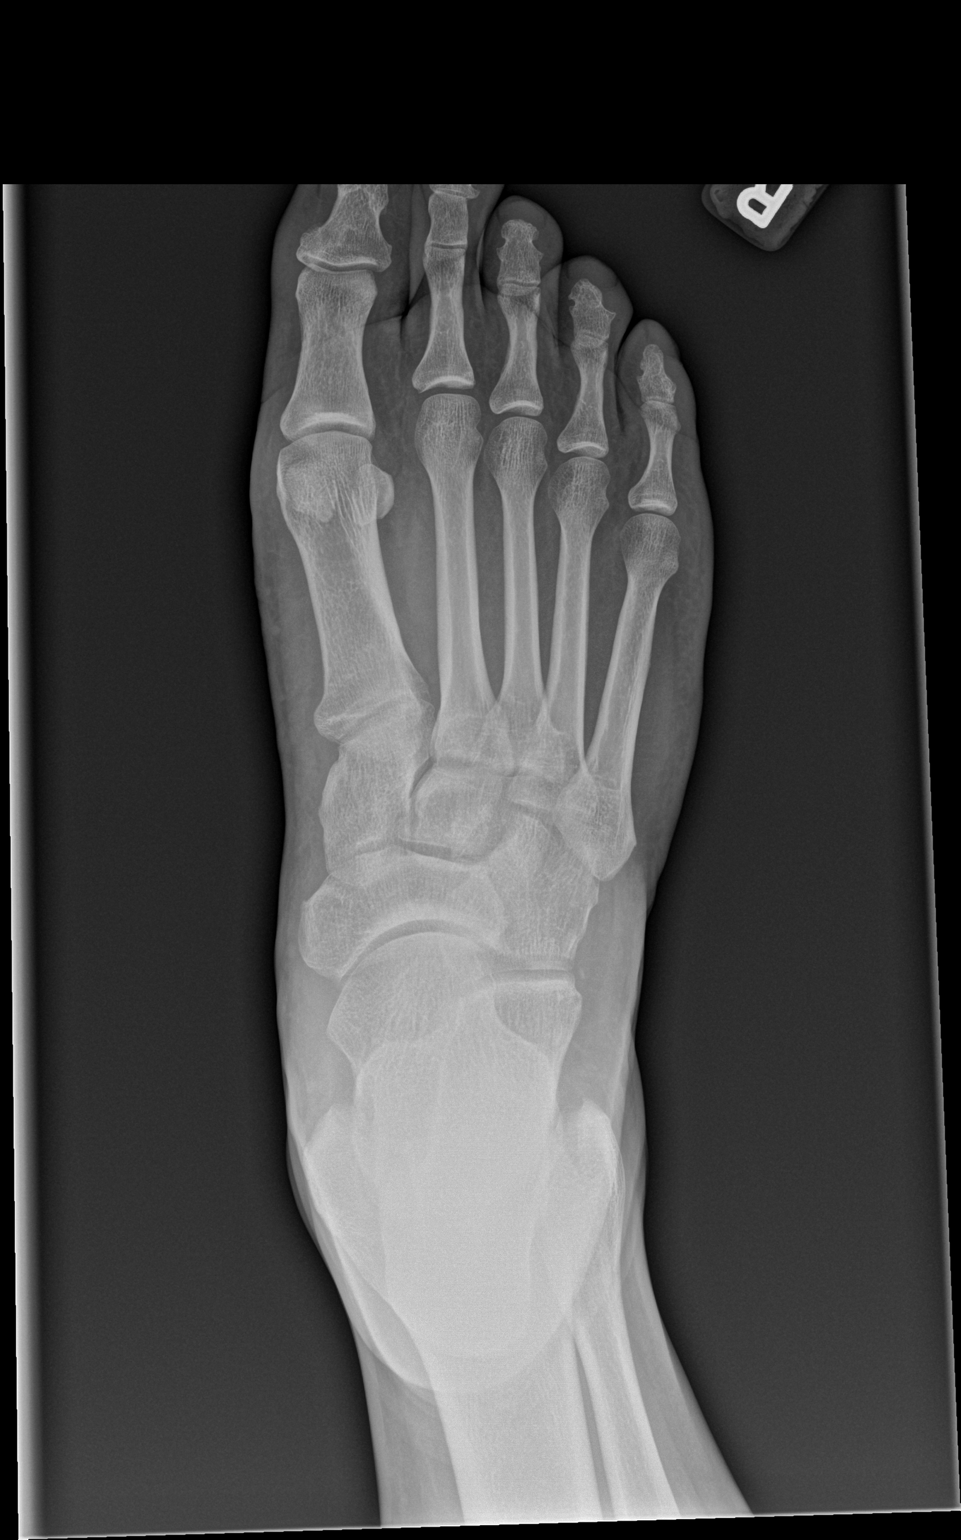

[x foot obl right]
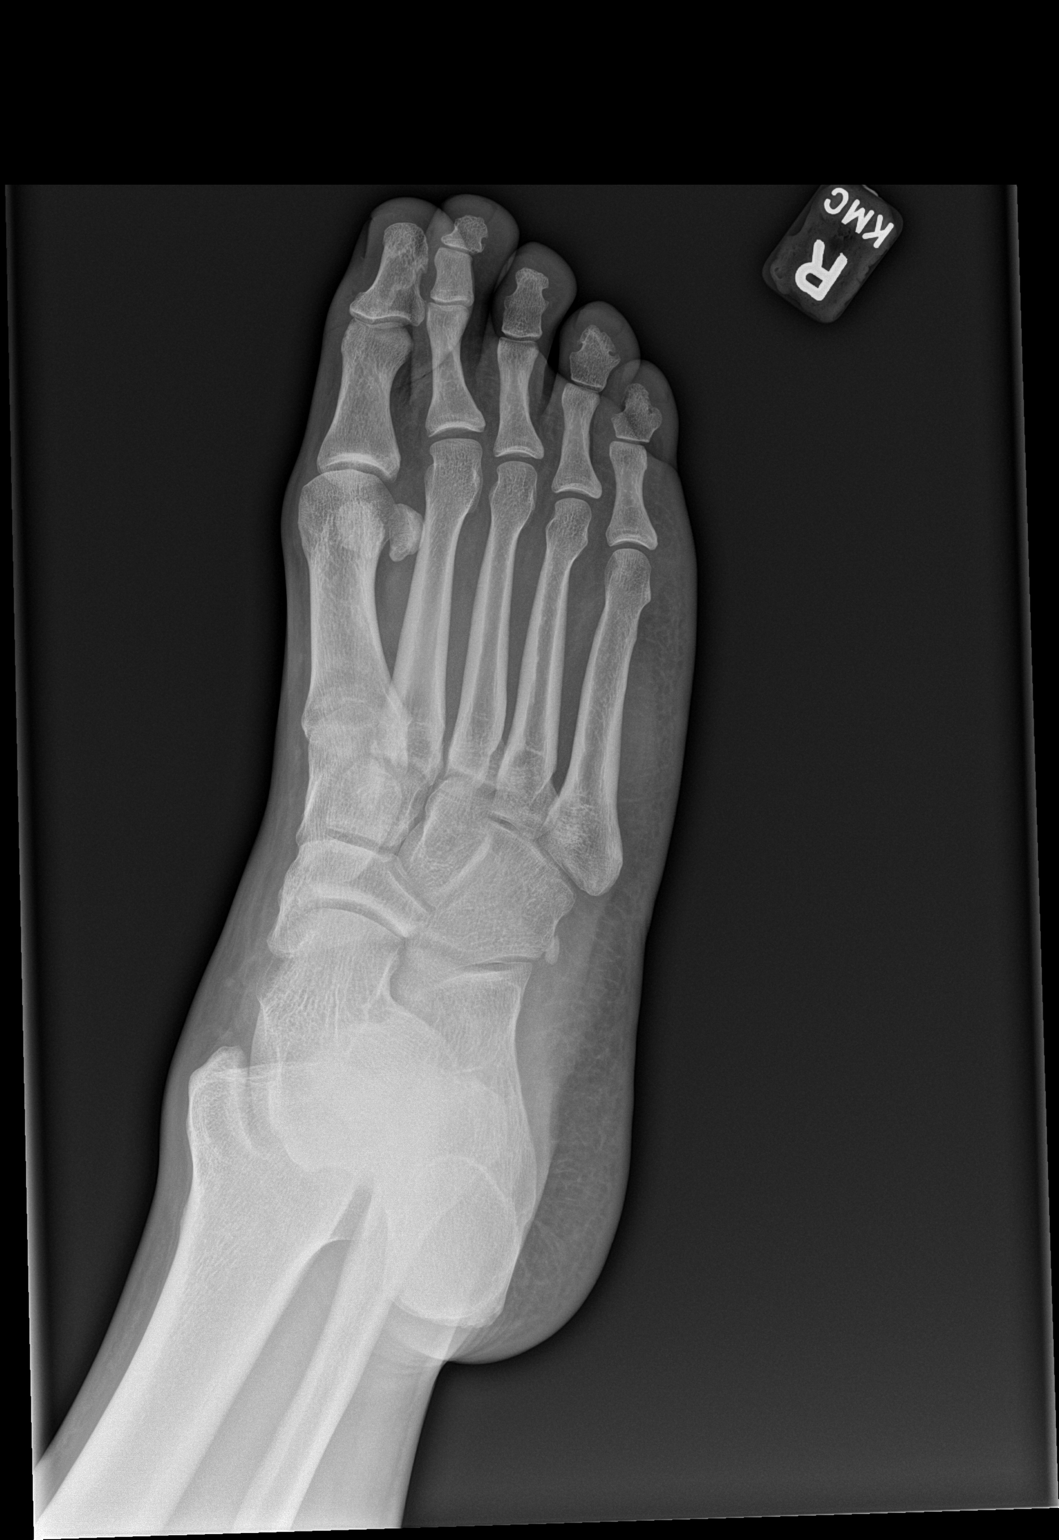

[x foot lat right]
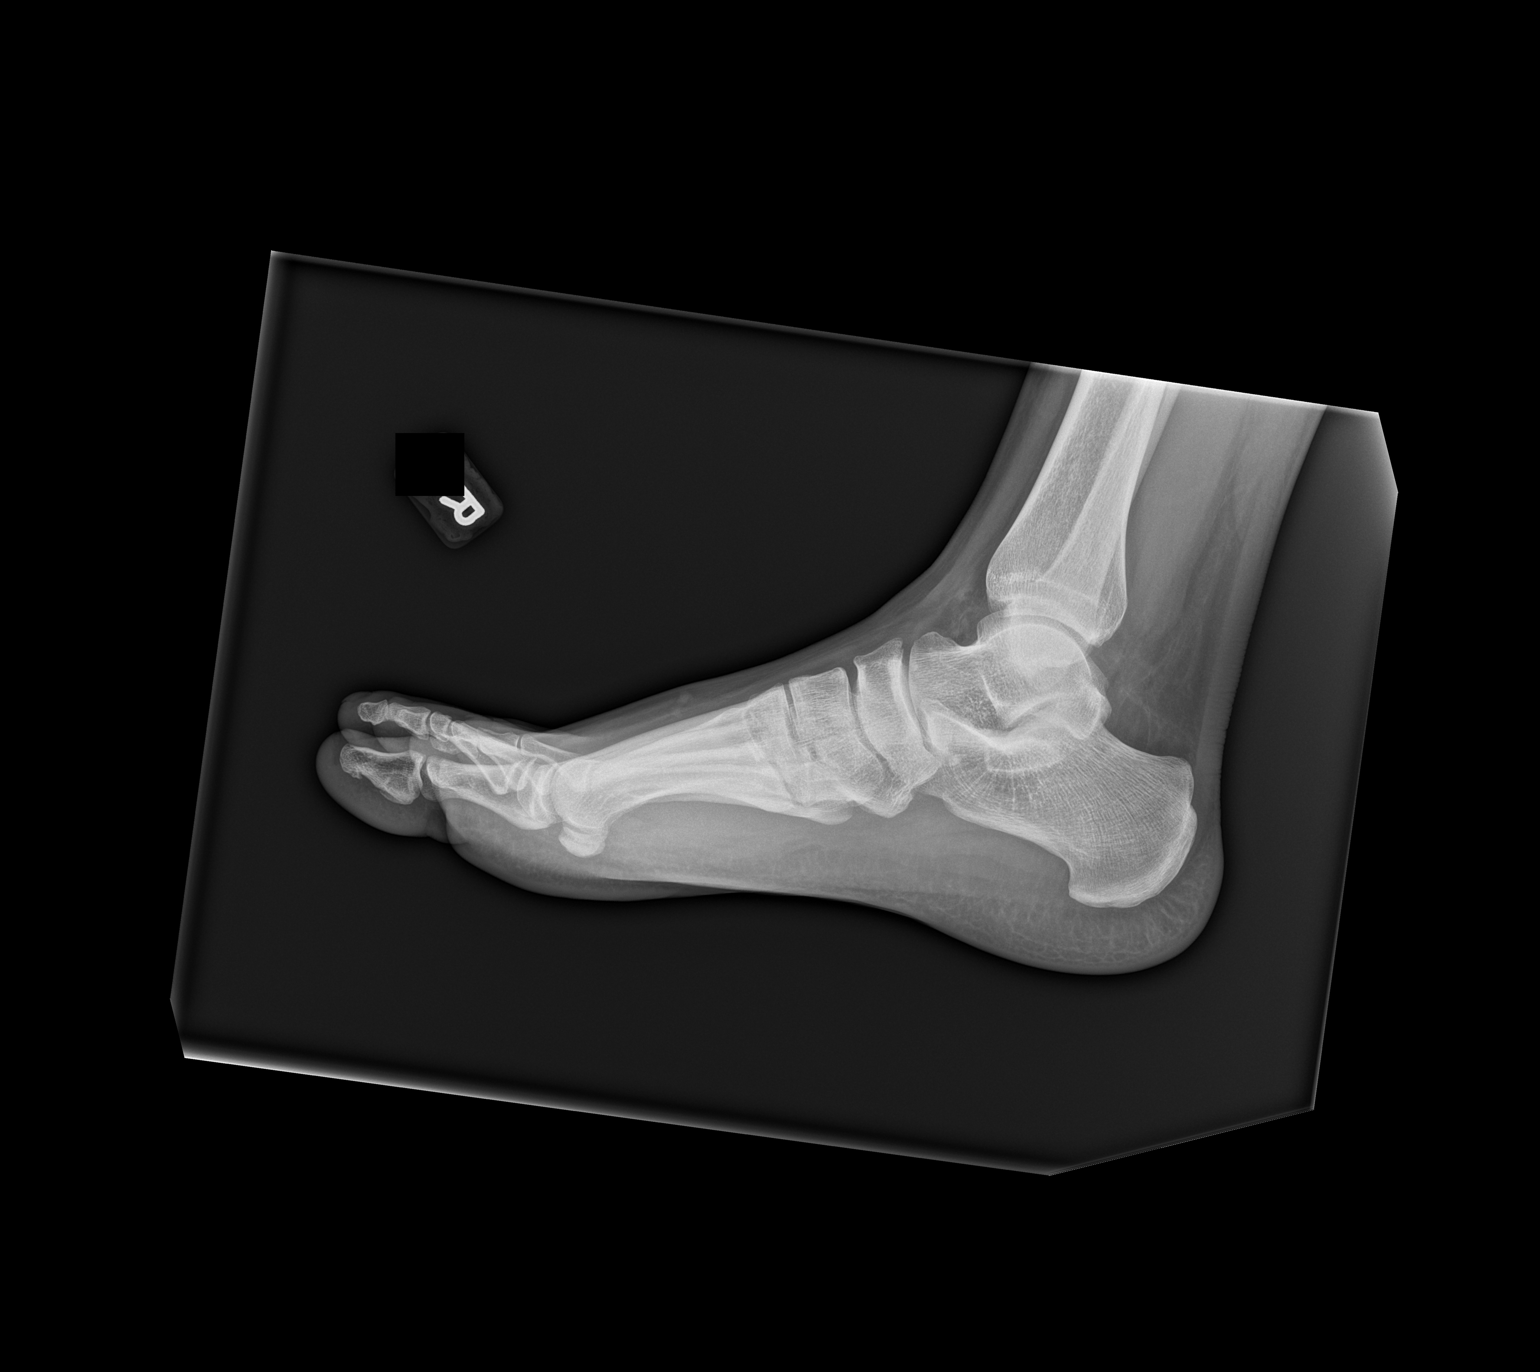

[3 of 3 positions shown; findings below may reference images not displayed]

FINDINGS: There is no evidence of fracture or dislocation. Normal alignment.
There is fusion of the [DATE], and fifth distal and middle
phalanges, normal variant. No erosion, periosteal reaction, or bone
destruction. Soft tissue edema is most prominent laterally
IMPRESSION: Soft tissue edema without acute osseous abnormality.

## 2022-04-01 ENCOUNTER — Encounter: Payer: Self-pay | Admitting: Nurse Practitioner

## 2022-04-01 ENCOUNTER — Ambulatory Visit: Payer: 59 | Admitting: Nurse Practitioner

## 2022-04-01 VITALS — BP 122/82 | HR 78 | Ht 76.0 in | Wt 301.0 lb

## 2022-04-01 DIAGNOSIS — K625 Hemorrhage of anus and rectum: Secondary | ICD-10-CM | POA: Diagnosis not present

## 2022-04-01 DIAGNOSIS — K6289 Other specified diseases of anus and rectum: Secondary | ICD-10-CM

## 2022-04-01 MED ORDER — NA SULFATE-K SULFATE-MG SULF 17.5-3.13-1.6 GM/177ML PO SOLN: 1.0000 | Freq: Once | ORAL | 0 refills | Status: AC

## 2022-04-01 NOTE — Progress Notes (Signed)
    Chief Complaint:  rectal pain and bleeding   Assessment &  Plan    # 31 yo male with chronic, intermittent rectal pain and bleeding with bowel movements.  Evaluated for same symptoms one year ago. DRE wasn't possible at that time due to his discomfort. Treated for suspected fissure without any improvement but he didn't return for follow up.  Schedule for a colonoscopy for further evaluation. The risks and benefits of colonoscopy with possible polypectomy / biopsies were discussed and the patient agrees to proceed.    HPI   Jerome [name]. is a very pleasant 31 y.o. male known to Dr. Meridee Score.  He has no significant past medical history other than obesity.   Jerome [name] was seen July 2022 for evaluation of rectal pain and bleeding with bowel movements. Digital rectal exam was limited by his severe discomfort. An anal fissure was suspected.  He was started on daily fiber and diltiazem gel.  He was to follow-up with me in 6 weeks but didn't return.   Interval History:  [name] was compliant with the Diltiazem ointment for 6-8 weeks but symptoms didn't resolve. It sounds like they really didn't even improve.  He is not constipated / doesn't strain  He averages about 2-3 bowel movements a day. Several weeks ago he had three weeks of unexplained diarrhea. His bowel movements are back to normal.      Previous GI Evaluation  none   History reviewed. No pertinent past medical history.  Past Surgical History:  Procedure Laterality Date   KNEE ARTHROSCOPY Right 2019    Current Medications, Allergies, Family History and Social History were reviewed in [employer] record.     Current Outpatient Medications  Medication Sig Dispense Refill   ibuprofen (ADVIL) 800 MG tablet Take 800 mg by mouth every 8 (eight) hours as needed.     AMBULATORY NON FORMULARY MEDICATION Medication Name: Diltazem ointment 5% use pea sized amount per rectum three times a day as needed  (Patient not taking: Reported on 04/01/2022) 30 g 1   No current facility-administered medications for this visit.    Review of Systems: No chest pain. No shortness of breath. No urinary complaints.    Physical Exam  Wt Readings from Last 3 Encounters:  04/01/22 (!) 301 lb (136.5 kg)  03/11/21 283 lb (128.4 kg)  10/23/18 277 lb (125.6 kg)    Ht 6\' 4"  (1.93 m)   Wt (!) 301 lb (136.5 kg)   BMI 36.64 kg/m  Constitutional:  Generally well appearing male in no acute distress. Psychiatric: Pleasant. Normal mood and affect. Behavior is normal. EENT: Pupils normal.  Conjunctivae are normal. No scleral icterus. Neck supple.  Cardiovascular: Normal rate, regular rhythm. No edema Pulmonary/chest: Effort normal and breath sounds normal. No wheezing, rales or rhonchi. Abdominal: Soft, nondistended, nontender. Bowel sounds active throughout. There are no masses palpable. No hepatomegaly. Neurological: Alert and oriented to person place and time. Skin: Skin is warm and dry. No rashes noted.  , NP  04/01/2022, 2:22 PM  Cc:  06/01/2022, MD

## 2022-04-01 NOTE — Progress Notes (Signed)
Attending Physician's Attestation   I have reviewed the chart.   I agree with the Advanced Practitioner's note, impression, and recommendations with any updates as below. In setting of persisting discomfort without effective treatment for fissure, agree with endoscopic evaluation and further DRE at time of procedure.   Corliss Parish, MD [facility] Office # [phone]

## 2022-04-01 NOTE — Patient Instructions (Signed)
If you are age 30 or younger, your body mass index should be between 19-25. Your Body mass index is 36.64 kg/m. If this is out of the aformentioned range listed, please consider follow up with your Primary Care Provider.  ________________________________________________________  The [place] [facility] providers would like to encourage you to use [facility] to communicate with providers for non-urgent requests or questions.  Due to long hold times on the telephone, sending your provider a message by [facility] may be a faster and more efficient way to get a response.  Please allow 48 business hours for a response.  Please remember that this is for non-urgent requests.  _______________________________________________________  Bonita Quin have been scheduled for a colonoscopy. Please follow written instructions given to you at your visit today.  Please pick up your prep supplies at the pharmacy within the next 1-3 days. If you use inhalers (even only as needed), please bring them with you on the day of your procedure.  Follow up pending the results of your Colonoscopy or as needed.  Thank you for entrusting me with your care and choosing [facility].  Willette Cluster, NP-C

## 2022-04-22 ENCOUNTER — Encounter: Payer: 59 | Admitting: Gastroenterology

## 2022-06-09 ENCOUNTER — Ambulatory Visit (AMBULATORY_SURGERY_CENTER): Payer: 59 | Admitting: Gastroenterology

## 2022-06-09 ENCOUNTER — Encounter: Payer: Self-pay | Admitting: Gastroenterology

## 2022-06-09 VITALS — BP 134/76 | HR 78 | Temp 97.5°F | Resp 15 | Ht 76.0 in | Wt 301.0 lb

## 2022-06-09 DIAGNOSIS — K621 Rectal polyp: Secondary | ICD-10-CM

## 2022-06-09 DIAGNOSIS — K640 First degree hemorrhoids: Secondary | ICD-10-CM

## 2022-06-09 DIAGNOSIS — K635 Polyp of colon: Secondary | ICD-10-CM | POA: Diagnosis not present

## 2022-06-09 DIAGNOSIS — K639 Disease of intestine, unspecified: Secondary | ICD-10-CM | POA: Diagnosis not present

## 2022-06-09 DIAGNOSIS — K6289 Other specified diseases of anus and rectum: Secondary | ICD-10-CM

## 2022-06-09 MED ORDER — AMBULATORY NON FORMULARY MEDICATION: 0 refills | Status: DC

## 2022-06-09 NOTE — Progress Notes (Signed)
Called to room to assist during endoscopic procedure.  Patient ID and intended procedure confirmed with present staff. Received instructions for my participation in the procedure from the performing physician.  

## 2022-06-09 NOTE — Progress Notes (Signed)
Pt's states no medical or surgical changes since previsit or office visit. 

## 2022-06-09 NOTE — Op Note (Signed)
[name] Patient Name: Jerome [name] Procedure Date: 06/09/2022 11:11 AM MRN: [id number] Endoscopist: Justice Britain , MD Age: 31 Referring MD:  Date of Birth: 1991-04-30 Gender: Male Account #: [id number] Procedure:                Colonoscopy Indications:              Rectal pain Medicines:                Monitored Anesthesia Care Procedure:                Pre-Anesthesia Assessment:                           - Prior to the procedure, a History and Physical                            was performed, and patient medications and                            allergies were reviewed. The patient's tolerance of                            previous anesthesia was also reviewed. The risks                            and benefits of the procedure and the sedation                            options and risks were discussed with the patient.                            All questions were answered, and informed consent                            was obtained. Prior Anticoagulants: The patient has                            taken no previous anticoagulant or antiplatelet                            agents except for NSAID medication. ASA Grade                            Assessment: II - A patient with mild systemic                            disease. After reviewing the risks and benefits,                            the patient was deemed in satisfactory condition to                            undergo the procedure.  After obtaining informed consent, the colonoscope                            was passed under direct vision. Throughout the                            procedure, the patient's blood pressure, pulse, and                            oxygen saturations were monitored continuously. The                            CF [id number] #[id number] was introduced through the anus                            and advanced to the 5 cm into the ileum. The                             colonoscopy was performed without difficulty. The                            patient tolerated the procedure. The quality of the                            bowel preparation was good. The terminal ileum,                            ileocecal valve, appendiceal orifice, and rectum                            were photographed. Scope In: 1:12:06 PM Scope Out: 1:23:42 PM Scope Withdrawal Time: 0 hours 9 minutes 26 seconds  Total Procedure Duration: 0 hours 11 minutes 36 seconds  Findings:                 The digital rectal exam findings include                            hemorrhoids. Pertinent negatives include no                            palpable rectal lesions.                           The terminal ileum and ileocecal valve appeared                            normal.                           A 4 mm polyp was found in the sigmoid colon. The                            polyp was sessile. The polyp was removed with a  cold snare. Resection and retrieval were complete.                           Normal mucosa was found in the entire colon                            otherwise.                           Normal mucosa was found in the rectum. Biopsies                            were taken with a cold forceps for histology to                            rule out proctitis.                           Non-bleeding non-thrombosed internal hemorrhoids                            were found during retroflexion, during perianal                            exam and during digital exam. The hemorrhoids were                            Grade I (internal hemorrhoids that do not prolapse). Complications:            No immediate complications. Estimated Blood Loss:     Estimated blood loss was minimal. Impression:               - Hemorrhoids found on digital rectal exam.                           - The examined portion of the ileum was normal.                           - One 4 mm  polyp in the sigmoid colon, removed with                            a cold snare. Resected and retrieved.                           - Normal mucosa in the entire examined colon                            otherwise.                           - Normal mucosa in the rectum to rule out proctitis                            - biopsied.                           -  Non-bleeding non-thrombosed internal hemorrhoids.                            No overt fissure noted. Recommendation:           - The patient will be observed post-procedure,                            until all discharge criteria are met.                           - Discharge patient to home.                           - Patient has a contact number available for                            emergencies. The signs and symptoms of potential                            delayed complications were discussed with the                            patient. Return to normal activities tomorrow.                            Written discharge instructions were provided to the                            patient.                           - High fiber diet.                           - NTG ointment 3-4x daily and after BM. Trial this                            for next 4-6 weeks. May stop if no improvement.                            I'll have my team send into the [facility].                           - Follow up pathology.                           - The findings and recommendations were discussed                            with the patient.                           - The findings and recommendations were discussed  with the designated responsible adult. Justice Britain, MD 06/09/2022 1:37:01 PM

## 2022-06-09 NOTE — Patient Instructions (Addendum)
Discharge instructions given. Handouts on polyps and Hemorrhoids. Resume previous medications.   You should apply a pea size amount to your rectum three-four times daily and as needed and after BM. Please take your printed prescription to [facility]  [facility]'s information is below:  Address: [address], [place], [place] [address]  [phone]     YOU HAD AN ENDOSCOPIC PROCEDURE TODAY AT [facility]:   Refer to the procedure report that was given to you for any specific questions about what was found during the examination.  If the procedure report does not answer your questions, please call your gastroenterologist to clarify.  If you requested that your care partner not be given the details of your procedure findings, then the procedure report has been included in a sealed envelope for you to review at your convenience later.  YOU SHOULD EXPECT: Some feelings of bloating in the abdomen. Passage of more gas than usual.  Walking can help get rid of the air that was put into your GI tract during the procedure and reduce the bloating. If you had a lower endoscopy (such as a colonoscopy or flexible sigmoidoscopy) you may notice spotting of blood in your stool or on the toilet paper. If you underwent a bowel prep for your procedure, you may not have a normal bowel movement for a few days.  Please Note:  You might notice some irritation and congestion in your nose or some drainage.  This is from the oxygen used during your procedure.  There is no need for concern and it should clear up in a day or so.  SYMPTOMS TO REPORT IMMEDIATELY:  Following lower endoscopy (colonoscopy or flexible sigmoidoscopy):  Excessive amounts of blood in the stool  Significant tenderness or worsening of abdominal pains  Swelling of the abdomen that is new, acute  Fever of 100F or higher   For urgent or emergent issues, a gastroenterologist can be reached at any hour  by calling [phone]. Do not use MyChart messaging for urgent concerns.    DIET:  We do recommend a small meal at first, but then you may proceed to your regular diet.  Drink plenty of fluids but you should avoid alcoholic beverages for 24 hours.  ACTIVITY:  You should plan to take it easy for the rest of today and you should NOT DRIVE or use heavy machinery until tomorrow (because of the sedation medicines used during the test).    FOLLOW UP: Our staff will call the number listed on your records the next business day following your procedure.  We will call around 7:15- 8:00 am to check on you and address any questions or concerns that you may have regarding the information given to you following your procedure. If we do not reach you, we will leave a message.     If any biopsies were taken you will be contacted by phone or by letter within the next 1-3 weeks.  Please call us at [phone] if you have not heard about the biopsies in 3 weeks.    SIGNATURES/CONFIDENTIALITY: You and/or your care partner have signed paperwork which will be entered into your electronic medical record.  These signatures attest to the fact that that the information above on your After Visit Summary has been reviewed and is understood.  Full responsibility of the confidentiality of this discharge information lies with you and/or your care-partner.

## 2022-06-09 NOTE — Progress Notes (Signed)
GASTROENTEROLOGY PROCEDURE H&P NOTE   Primary Care Physician: Lawerance Cruel, MD  HPI: Jerome [name]. is a 31 y.o. male who presents for Colonoscopy for evaluation of rectal pain with previous fissure treatment and still having discomfort.   History reviewed. No pertinent past medical history. Past Surgical History:  Procedure Laterality Date   KNEE ARTHROSCOPY Right 2019   Current Outpatient Medications  Medication Sig Dispense Refill   AMBULATORY NON FORMULARY MEDICATION Medication Name: Diltazem ointment 5% use pea sized amount per rectum three times a day as needed 30 g 1   ibuprofen (ADVIL) 800 MG tablet Take 800 mg by mouth every 8 (eight) hours as needed.     No current facility-administered medications for this visit.    Current Outpatient Medications:    AMBULATORY NON FORMULARY MEDICATION, Medication Name: Diltazem ointment 5% use pea sized amount per rectum three times a day as needed, Disp: 30 g, Rfl: 1   ibuprofen (ADVIL) 800 MG tablet, Take 800 mg by mouth every 8 (eight) hours as needed., Disp: , Rfl:  No Known Allergies Family History  Problem Relation Age of Onset   Diabetes Mother    Hypertension Father    Diabetes Paternal Grandmother    Hypertension Paternal Grandmother    Colon cancer Neg Hx    Esophageal cancer Neg Hx    Stomach cancer Neg Hx    Rectal cancer Neg Hx    Social History   Socioeconomic History   Marital status: Single    Spouse name: Not on file   Number of children: 1   Years of education: Not on file   Highest education level: Not on file  Occupational History   Not on file  Tobacco Use   Smoking status: Never   Smokeless tobacco: Never  Vaping Use   Vaping Use: Never used  Substance and Sexual Activity   Alcohol use: Yes    Comment: occ   Drug use: No   Sexual activity: Yes  Other Topics Concern   Not on file  Social History Narrative   Not on file   Social Determinants of Health   Financial Resource  Strain: Not on file  Food Insecurity: Not on file  Transportation Needs: Not on file  Physical Activity: Not on file  Stress: Not on file  Social Connections: Not on file  Intimate Partner Violence: Not on file    Physical Exam: Today's Vitals   06/09/22 1056  BP: 127/70  Pulse: 81  Temp: (!) 97.5 F (36.4 C)  TempSrc: Temporal  SpO2: 99%  Weight: (!) 301 lb (136.5 kg)  Height: 6\' 4"  (1.93 m)   Body mass index is 36.64 kg/m. GEN: NAD EYE: Sclerae anicteric ENT: MMM CV: Non-tachycardic GI: Soft, NT/ND NEURO:  Alert & Oriented x 3  Lab Results: No results for input(s): "WBC", "HGB", "HCT", "PLT" in the last 72 hours. BMET No results for input(s): "NA", "K", "CL", "CO2", "GLUCOSE", "BUN", "CREATININE", "CALCIUM" in the last 72 hours. LFT No results for input(s): "PROT", "ALBUMIN", "AST", "ALT", "ALKPHOS", "BILITOT", "BILIDIR", "IBILI" in the last 72 hours. PT/INR No results for input(s): "LABPROT", "INR" in the last 72 hours.   Impression / Plan: This is a 31 y.o.male who presents for Colonoscopy for evaluation of rectal pain with previous fissure treatment and still having discomfort.   The risks and benefits of endoscopic evaluation/treatment were discussed with the patient and/or family; these include but are not limited to the risk  of perforation, infection, bleeding, missed lesions, lack of diagnosis, severe illness requiring hospitalization, as well as anesthesia and sedation related illnesses.  The patient's history has been reviewed, patient examined, no change in status, and deemed stable for procedure.  The patient and/or family is agreeable to proceed.    Jerome Parish, MD [facility] Office # [phone]

## 2022-06-09 NOTE — Progress Notes (Signed)
A and O x3. Report to RN. Tolerated MAC anesthesia well. 

## 2022-06-10 ENCOUNTER — Telehealth: Payer: Self-pay

## 2022-06-10 NOTE — Telephone Encounter (Signed)
  Follow up Call-     06/09/2022   10:58 AM  Call back number  Post procedure Call Back phone  # [phone]  Permission to leave phone message Yes     Patient questions:  Do you have a fever, pain , or abdominal swelling? No. Pain Score  0 *  Have you tolerated food without any problems? Yes.    Have you been able to return to your normal activities? Yes.    Do you have any questions about your discharge instructions: Diet   No. Medications  No. Follow up visit  No.  Do you have questions or concerns about your Care? No.  Actions: * If pain score is 4 or above: No action needed, pain <4.

## 2022-06-15 ENCOUNTER — Encounter: Payer: Self-pay | Admitting: Gastroenterology

## 2022-10-24 ENCOUNTER — Other Ambulatory Visit: Payer: Self-pay

## 2022-10-24 ENCOUNTER — Encounter (HOSPITAL_BASED_OUTPATIENT_CLINIC_OR_DEPARTMENT_OTHER): Payer: Self-pay | Admitting: Emergency Medicine

## 2022-10-24 ENCOUNTER — Emergency Department (HOSPITAL_BASED_OUTPATIENT_CLINIC_OR_DEPARTMENT_OTHER): Payer: 59 | Admitting: Radiology

## 2022-10-24 ENCOUNTER — Emergency Department (HOSPITAL_BASED_OUTPATIENT_CLINIC_OR_DEPARTMENT_OTHER)
Admission: EM | Admit: 2022-10-24 | Discharge: 2022-10-25 | Disposition: A | Discharge status: Home / Self Care | Payer: 59 | Attending: Emergency Medicine | Admitting: Emergency Medicine

## 2022-10-24 DIAGNOSIS — Y92410 Unspecified street and highway as the place of occurrence of the external cause: Secondary | ICD-10-CM | POA: Diagnosis not present

## 2022-10-24 DIAGNOSIS — M546 Pain in thoracic spine: Secondary | ICD-10-CM | POA: Insufficient documentation

## 2022-10-24 DIAGNOSIS — M25511 Pain in right shoulder: Secondary | ICD-10-CM | POA: Insufficient documentation

## 2022-10-24 DIAGNOSIS — S39012A Strain of muscle, fascia and tendon of lower back, initial encounter: Secondary | ICD-10-CM | POA: Diagnosis not present

## 2022-10-24 DIAGNOSIS — M549 Dorsalgia, unspecified: Secondary | ICD-10-CM | POA: Diagnosis present

## 2022-10-24 MED ORDER — OXYCODONE-ACETAMINOPHEN 5-325 MG PO TABS
2.0000 | ORAL_TABLET | Freq: Once | ORAL | Status: AC
  Administered 2022-10-25: 1 via ORAL
  Filled 2022-10-24: qty 2

## 2022-10-24 MED ORDER — KETOROLAC TROMETHAMINE 30 MG/ML IJ SOLN
30.0000 mg | Freq: Once | INTRAMUSCULAR | Status: AC
  Administered 2022-10-25: 30 mg via INTRAMUSCULAR
  Filled 2022-10-24: qty 1
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
## 2022-10-25 MED ORDER — CYCLOBENZAPRINE HCL 10 MG PO TABS: 10.0000 mg | ORAL_TABLET | Freq: Two times a day (BID) | ORAL | 0 refills | Status: DC | PRN

## 2022-10-25 MED ORDER — OXYCODONE-ACETAMINOPHEN 5-325 MG PO TABS: 1.0000 | ORAL_TABLET | Freq: Four times a day (QID) | ORAL | 0 refills | Status: DC | PRN

## 2022-10-25 MED ORDER — OXYCODONE-ACETAMINOPHEN 5-325 MG PO TABS
1.0000 | ORAL_TABLET | Freq: Once | ORAL | Status: AC
  Administered 2022-10-25: 1 via ORAL
  Filled 2022-10-25: qty 1
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
## 2022-10-25 NOTE — ED Notes (Signed)
RT had pt perform IS with goal of 1500 mLs. Pt able to obtain 2500 mLs. Pt able to perform without difficulty. Pt verbalizes understanding.
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
## 2023-01-15 ENCOUNTER — Other Ambulatory Visit: Payer: Self-pay | Admitting: Sports Medicine

## 2023-01-15 DIAGNOSIS — M542 Cervicalgia: Secondary | ICD-10-CM

## 2023-02-01 ENCOUNTER — Ambulatory Visit
Admission: RE | Admit: 2023-02-01 | Discharge: 2023-02-01 | Disposition: A | Discharge status: Home / Self Care | Payer: 59 | Source: Ambulatory Visit | Attending: Sports Medicine | Admitting: Sports Medicine

## 2023-02-01 DIAGNOSIS — M542 Cervicalgia: Secondary | ICD-10-CM

## 2023-02-23 ENCOUNTER — Encounter: Payer: Self-pay | Admitting: Physical Medicine & Rehabilitation

## 2023-04-07 ENCOUNTER — Encounter: Payer: 59 | Admitting: Physical Medicine & Rehabilitation

## 2023-05-24 ENCOUNTER — Encounter: Payer: 59 | Admitting: Physical Medicine & Rehabilitation

## 2023-12-01 ENCOUNTER — Other Ambulatory Visit: Payer: Self-pay

## 2023-12-01 ENCOUNTER — Encounter (HOSPITAL_BASED_OUTPATIENT_CLINIC_OR_DEPARTMENT_OTHER): Payer: Self-pay

## 2023-12-01 ENCOUNTER — Emergency Department (HOSPITAL_BASED_OUTPATIENT_CLINIC_OR_DEPARTMENT_OTHER)
Admission: EM | Admit: 2023-12-01 | Discharge: 2023-12-01 | Disposition: A | Discharge status: Home / Self Care | Payer: Worker's Compensation | Attending: Emergency Medicine | Admitting: Emergency Medicine

## 2023-12-01 DIAGNOSIS — S46811A Strain of other muscles, fascia and tendons at shoulder and upper arm level, right arm, initial encounter: Secondary | ICD-10-CM | POA: Insufficient documentation

## 2023-12-01 DIAGNOSIS — X500XXA Overexertion from strenuous movement or load, initial encounter: Secondary | ICD-10-CM | POA: Insufficient documentation

## 2023-12-01 DIAGNOSIS — Y990 Civilian activity done for income or pay: Secondary | ICD-10-CM | POA: Diagnosis not present

## 2023-12-01 DIAGNOSIS — S199XXA Unspecified injury of neck, initial encounter: Secondary | ICD-10-CM | POA: Diagnosis present

## 2023-12-01 MED ORDER — CYCLOBENZAPRINE HCL 10 MG PO TABS: 10.0000 mg | ORAL_TABLET | Freq: Two times a day (BID) | ORAL | 0 refills | Status: DC | PRN

## 2023-12-01 MED ORDER — NAPROXEN 500 MG PO TABS: 500.0000 mg | ORAL_TABLET | Freq: Two times a day (BID) | ORAL | 0 refills | Status: DC

## 2023-12-01 MED ORDER — NAPROXEN 500 MG PO TABS: 500.0000 mg | ORAL_TABLET | Freq: Two times a day (BID) | ORAL | 0 refills | Status: AC

## 2023-12-01 NOTE — ED Notes (Addendum)
 Patient spoke with employer. Drug screen not required at ED for workman's comp.

## 2023-12-01 NOTE — ED Triage Notes (Signed)
 Pt presents via POV c/o neck pain., Reports feels like pulled a muscle in neck while working.

## 2023-12-01 NOTE — ED Provider Notes (Signed)
 [place] EMERGENCY DEPARTMENT AT [facility] Provider Note   CSN: [id number] Arrival date & time: 12/01/23  5170     History  Chief Complaint  Patient presents with   Neck Injury    Jerome [name]. is a 33 y.o. male.   Neck Injury     Patient presents to the ER for evaluation of neck pain.  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  Patient will using the machine had to strain and felt a pull on the right side of his neck.  Patient states since that time he has been having trouble turning his neck.  He denies any falls.  He has had some pins and needle sensation in his hand since then.  He is not have any weakness.  No complaints of chest pain shortness of breath  Home Medications Prior to Admission medications   Medication Sig Start Date End Date Taking? Authorizing Provider  cyclobenzaprine (FLEXERIL) 10 MG tablet Take 1 tablet (10 mg total) by mouth 2 (two) times daily as needed for muscle spasms. 12/01/23  Yes Linwood Dibbles, MD  naproxen (NAPROSYN) 500 MG tablet Take 1 tablet (500 mg total) by mouth 2 (two) times daily. 12/01/23  Yes Linwood Dibbles, MD  AMBULATORY NON FORMULARY MEDICATION Medication Name: Diltazem ointment 5% use pea sized amount per rectum three times a day as needed 03/11/21   Meredith Pel, NP  AMBULATORY NON FORMULARY MEDICATION Medication Name: Nitroglycerin 0.125% gel. Apply pea size amount to the rectum 3-4 times daily and after BM 06/09/22   Mansouraty, Netty Starring., MD  ibuprofen (ADVIL) 800 MG tablet Take 800 mg by mouth every 8 (eight) hours as needed.    [provider]  oxyCODONE-acetaminophen (PERCOCET/ROXICET) 5-325 MG tablet Take 1 tablet by mouth every 6 (six) hours as needed for severe pain. 10/25/22   Smitty Knudsen, PA-C      Allergies    Patient has no known allergies.    Review of Systems   Review of Systems  Physical Exam Updated Vital Signs BP (!) 130/100   Pulse 77   Temp 98.2  F (36.8 C) (Temporal)   Resp 16   Ht 1.93 m (6\' 4" )   Wt 136.1 kg   SpO2 100%   BMI 36.52 kg/m  Physical Exam Vitals and nursing note reviewed.  Constitutional:      General: He is not in acute distress.    Appearance: He is well-developed.  HENT:     Head: Normocephalic and atraumatic.     Right Ear: External ear normal.     Left Ear: External ear normal.  Eyes:     General: No scleral icterus.       Right eye: No discharge.        Left eye: No discharge.     Conjunctiva/sclera: Conjunctivae normal.  Neck:     Trachea: No tracheal deviation.  Cardiovascular:     Rate and Rhythm: Normal rate.  Pulmonary:     Effort: Pulmonary effort is normal. No respiratory distress.     Breath sounds: No stridor.  Abdominal:     General: There is no distension.  Musculoskeletal:        General: No swelling or deformity.     Cervical back: Neck supple.     Comments: Normal pulses and normal capillary refill bilateral upper extremities, tenderness palpation right trapezius muscle, no midline cervical spine tenderness, no shoulder joint  tenderness, no pain with range of motion of the shoulder  Skin:    General: Skin is warm and dry.     Findings: No rash.  Neurological:     Mental Status: He is alert. Mental status is at baseline.     Cranial Nerves: No dysarthria or facial asymmetry.     Motor: No seizure activity.     Comments: Normal grip strength, normal sensation bilaterally     ED Results / Procedures / Treatments   Labs (all labs ordered are listed, but only abnormal results are displayed) Labs Reviewed - No data to display  EKG None  Radiology No results found.  Procedures Procedures    Medications Ordered in ED Medications - No data to display  ED Course/ Medical Decision Making/ A&P                                 Medical Decision Making  Findings are consistent with trapezius muscle strain.  Prior records reviewed and patient did have an MRI in June of  last year that showed multilevel cervical spondylosis without significant spinal stenosis.  He had some mild foraminal narrowing at C3-C4 on the left and C6 and C7 on the right.  Symptoms could be related to cervical impingement however he has more focal tenderness in the trapezius muscle and I suspect this is more likely related to that.  Will have him try course of NSAIDs and muscle relaxants.  Plan on outpatient follow-up with occupational health or orthopedics        Final Clinical Impression(s) / ED Diagnoses Final diagnoses:  Trapezius strain, right, initial encounter    Rx / DC Orders ED Discharge Orders          Ordered    naproxen (NAPROSYN) 500 MG tablet  2 times daily        12/01/23 0739    cyclobenzaprine (FLEXERIL) 10 MG tablet  2 times daily PRN        12/01/23 0739              Linwood Dibbles, MD 12/01/23 [phone]

## 2023-12-01 NOTE — Discharge Instructions (Addendum)
 Take the medications as prescribed.  Follow-up with occupational medicine or an orthopedic doctor to make sure your symptoms improve.  Rest for the next several days.  You can return to full activity once cleared by the occupational medicine orthopedic doctor

## 2023-12-01 NOTE — ED Notes (Signed)
 Asked Dr. Lynelle Doctor to send prescribed medications to different pharmacy due to patient preference. Pharmacy updated in patient's record.

## 2024-03-15 ENCOUNTER — Other Ambulatory Visit: Payer: Self-pay | Admitting: Family Medicine

## 2024-03-15 DIAGNOSIS — Z8719 Personal history of other diseases of the digestive system: Secondary | ICD-10-CM

## 2024-03-15 DIAGNOSIS — R197 Diarrhea, unspecified: Secondary | ICD-10-CM

## 2024-03-15 DIAGNOSIS — R109 Unspecified abdominal pain: Secondary | ICD-10-CM

## 2024-03-20 ENCOUNTER — Ambulatory Visit
Admission: RE | Admit: 2024-03-20 | Discharge: 2024-03-20 | Disposition: A | Discharge status: Home / Self Care | Source: Ambulatory Visit | Attending: Family Medicine | Admitting: Family Medicine

## 2024-03-20 DIAGNOSIS — Z8719 Personal history of other diseases of the digestive system: Secondary | ICD-10-CM

## 2024-03-20 DIAGNOSIS — R197 Diarrhea, unspecified: Secondary | ICD-10-CM

## 2024-03-20 DIAGNOSIS — R109 Unspecified abdominal pain: Secondary | ICD-10-CM

## 2024-03-20 MED ORDER — IOPAMIDOL (ISOVUE-300) INJECTION 61%
100.0000 mL | Freq: Once | INTRAVENOUS | Status: AC | PRN
  Administered 2024-03-20: 100 mL via INTRAVENOUS

## 2024-04-26 ENCOUNTER — Encounter: Payer: Self-pay | Admitting: Physician Assistant

## 2024-04-26 ENCOUNTER — Ambulatory Visit: Admitting: Physician Assistant

## 2024-04-26 VITALS — BP 126/80 | HR 86 | Ht 76.0 in | Wt 293.0 lb

## 2024-04-26 DIAGNOSIS — K219 Gastro-esophageal reflux disease without esophagitis: Secondary | ICD-10-CM | POA: Diagnosis not present

## 2024-04-26 DIAGNOSIS — K625 Hemorrhage of anus and rectum: Secondary | ICD-10-CM | POA: Diagnosis not present

## 2024-04-26 DIAGNOSIS — Z8619 Personal history of other infectious and parasitic diseases: Secondary | ICD-10-CM

## 2024-04-26 MED ORDER — HYDROCORTISONE ACETATE 25 MG RE SUPP: 25.0000 mg | Freq: Two times a day (BID) | RECTAL | 1 refills | Status: DC
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
## 2024-04-26 NOTE — Patient Instructions (Signed)
 We have sent the following medications to your pharmacy for you to pick up at your convenience: Hydrocortisone  Suppositories   Continue diet change.   Due to recent changes in healthcare laws, you may see the results of your imaging and laboratory studies on MyChart before your provider has had a chance to review them.  We understand that in some cases there may be results that are confusing or concerning to you. Not all laboratory results come back in the same time frame and the provider may be waiting for multiple results in order to interpret others.  Please give us  48 hours in order for your provider to thoroughly review all the results before contacting the office for clarification of your results.   _______________________________________________________  If your blood pressure at your visit was 140/90 or greater, please contact your primary care physician to follow up on this.  _______________________________________________________  If you are age 32 or older, your body mass index should be between 23-30. Your Body mass index is 35.67 kg/m. If this is out of the aforementioned range listed, please consider follow up with your Primary Care Provider.  If you are age 13 or younger, your body mass index should be between 19-25. Your Body mass index is 35.67 kg/m. If this is out of the aformentioned range listed, please consider follow up with your Primary Care Provider.   ________________________________________________________  The [facility] providers would like to encourage you to use MYCHART to communicate with providers for non-urgent requests or questions.  Due to long hold times on the telephone, sending your provider a message by [facility] may be a faster and more efficient way to get a response.  Please allow 48 business hours for a response.  Please remember that this is for non-urgent requests.  _______________________________________________________  [facility] is  using a team-based approach to care.  Your team is made up of your doctor and two to three APPS. Our APPS (Nurse Practitioners and Physician Assistants) work with your physician to ensure care continuity for you. They are fully qualified to address your health concerns and develop a treatment plan. They communicate directly with your gastroenterologist to care for you. Seeing the Advanced Practice Practitioners on your physician's team can help you by facilitating care more promptly, often allowing for earlier appointments, access to diagnostic testing, procedures, and other specialty referrals.   Thank you for choosing me and [facility].  Delon Failing, PA-C

## 2024-05-01 NOTE — Progress Notes (Signed)
 Attending Physician's Attestation   I have reviewed the chart.   I agree with the Advanced Practitioner's note, impression, and recommendations with any updates as below.    Corliss Parish, MD [facility] Advanced Endoscopy Office # [phone]

## 2024-05-03 ENCOUNTER — Other Ambulatory Visit: Payer: Self-pay | Admitting: Physician Assistant

## 2024-05-03 NOTE — Progress Notes (Signed)
 Test results  9//25 4:12 PM  Received results from [facility] testing which showed H. pylori not detected.  Delon Failing, PA-C.

## 2024-05-04 ENCOUNTER — Telehealth: Payer: Self-pay | Admitting: *Deleted

## 2024-05-04 NOTE — Telephone Encounter (Signed)
-----   Message from Delon Hendricks Failing sent at 05/03/2024  4:13 PM EDT ----- Regarding: [facility] testing Please let patient know that his recent testing for H. pylori was negative.  Thanks, JL L

## 2024-05-07 NOTE — Telephone Encounter (Signed)
 Patient informed of results.

## 2024-06-13 ENCOUNTER — Encounter: Payer: Self-pay | Admitting: Physician Assistant
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
## 2024-09-13 ENCOUNTER — Other Ambulatory Visit

## 2024-09-13 ENCOUNTER — Ambulatory Visit: Admitting: Physician Assistant

## 2024-09-13 ENCOUNTER — Ambulatory Visit: Payer: Self-pay | Admitting: Physician Assistant

## 2024-09-13 ENCOUNTER — Encounter: Payer: Self-pay | Admitting: Physician Assistant

## 2024-09-13 VITALS — BP 120/82 | HR 86 | Ht 76.0 in | Wt 302.8 lb

## 2024-09-13 DIAGNOSIS — K625 Hemorrhage of anus and rectum: Secondary | ICD-10-CM

## 2024-09-13 DIAGNOSIS — K6289 Other specified diseases of anus and rectum: Secondary | ICD-10-CM

## 2024-09-13 DIAGNOSIS — K602 Anal fissure, unspecified: Secondary | ICD-10-CM

## 2024-09-13 LAB — CBC WITH DIFFERENTIAL/PLATELET
Basophils Absolute: 0 K/uL (ref 0.0–0.1)
Basophils Relative: 0.2 % (ref 0.0–3.0)
Eosinophils Absolute: 0.1 K/uL (ref 0.0–0.7)
Eosinophils Relative: 2 % (ref 0.0–5.0)
HCT: 41.7 % (ref 39.0–52.0)
Hemoglobin: 14.1 g/dL (ref 13.0–17.0)
Lymphocytes Relative: 44.4 % (ref 12.0–46.0)
Lymphs Abs: 2.5 K/uL (ref 0.7–4.0)
MCHC: 33.7 g/dL (ref 30.0–36.0)
MCV: 85.2 fl (ref 78.0–100.0)
Monocytes Absolute: 0.6 K/uL (ref 0.1–1.0)
Monocytes Relative: 10.1 % (ref 3.0–12.0)
Neutro Abs: 2.4 K/uL (ref 1.4–7.7)
Neutrophils Relative %: 43.3 % (ref 43.0–77.0)
Platelets: 354 K/uL (ref 150.0–400.0)
RBC: 4.89 Mil/uL (ref 4.22–5.81)
RDW: 13 % (ref 11.5–15.5)
WBC: 5.6 K/uL (ref 4.0–10.5)

## 2024-09-13 LAB — IBC + FERRITIN
Ferritin: 19.6 ng/mL — ABNORMAL LOW (ref 22.0–322.0)
Iron: 67 ug/dL (ref 42–165)
Saturation Ratios: 16.1 % — ABNORMAL LOW (ref 20.0–50.0)
TIBC: 417.2 ug/dL (ref 250.0–450.0)
Transferrin: 298 mg/dL (ref 212.0–360.0)

## 2024-09-13 MED ORDER — AMBULATORY NON FORMULARY MEDICATION: 1 refills | Status: AC

## 2024-09-13 NOTE — Patient Instructions (Signed)
 Your provider has requested that you go to the basement level for lab work before leaving today. Press B on the elevator. The lab is located at the first door on the left as you exit the elevator.  We have sent a prescription for Diltiazem 2% gel to [facility] for you. Using your index finger, you should apply a small amount of medication inside the rectum up to your first knuckle/joint three times daily x 6-8 weeks.  [facility]'s information is below: Address: [address], [place], [place] [address]  [phone]  *Please DO NOT go directly from our office to pick up this medication! Give the pharmacy 1 day to process the prescription as this is compounded and takes time to make.  Use Reticare w/Lidocaine  OTC as needed.  Referral placed to [facility].

## 2024-09-19 NOTE — Progress Notes (Signed)
 This was routed to me, so I have included Dr. Wilhelmenia on it since it is his patient.  - HD

## 2024-09-20 NOTE — Progress Notes (Signed)
 Attending Physician's Attestation   I have reviewed the chart.   I agree with the Advanced Practitioner's note, impression, and recommendations with any updates as below.    Corliss Parish, MD [facility] Advanced Endoscopy Office # [phone]
